# Patient Record
Sex: Female | Born: 1995 | Hispanic: No | Marital: Single | Smoking: Current every day smoker
Health system: Southern US, Community
[De-identification: ages and names within clinical notes are randomized; demographics above are authoritative.]

## PROBLEM LIST (undated history)

## (undated) DIAGNOSIS — N83299 Other ovarian cyst, unspecified side: Secondary | ICD-10-CM

## (undated) DIAGNOSIS — N390 Urinary tract infection, site not specified: Secondary | ICD-10-CM

---

## 2019-04-22 ENCOUNTER — Encounter (HOSPITAL_COMMUNITY): Payer: Self-pay | Admitting: Emergency Medicine

## 2019-04-22 ENCOUNTER — Other Ambulatory Visit: Payer: Self-pay

## 2019-04-22 ENCOUNTER — Emergency Department (HOSPITAL_COMMUNITY)
Admission: EM | Admit: 2019-04-22 | Discharge: 2019-04-22 | Disposition: A | Discharge status: Home / Self Care | Payer: Medicaid - Out of State | Attending: Emergency Medicine | Admitting: Emergency Medicine

## 2019-04-22 ENCOUNTER — Emergency Department (HOSPITAL_COMMUNITY): Payer: Medicaid - Out of State

## 2019-04-22 DIAGNOSIS — Z79899 Other long term (current) drug therapy: Secondary | ICD-10-CM | POA: Insufficient documentation

## 2019-04-22 DIAGNOSIS — R109 Unspecified abdominal pain: Secondary | ICD-10-CM | POA: Diagnosis present

## 2019-04-22 DIAGNOSIS — D259 Leiomyoma of uterus, unspecified: Secondary | ICD-10-CM | POA: Insufficient documentation

## 2019-04-22 DIAGNOSIS — N390 Urinary tract infection, site not specified: Secondary | ICD-10-CM | POA: Diagnosis not present

## 2019-04-22 DIAGNOSIS — F17210 Nicotine dependence, cigarettes, uncomplicated: Secondary | ICD-10-CM | POA: Diagnosis not present

## 2019-04-22 DIAGNOSIS — R102 Pelvic and perineal pain: Secondary | ICD-10-CM | POA: Insufficient documentation

## 2019-04-22 HISTORY — DX: Other ovarian cyst, unspecified side: N83.299

## 2019-04-22 HISTORY — DX: Urinary tract infection, site not specified: N39.0

## 2019-04-22 LAB — PREGNANCY, URINE: Preg Test, Ur: NEGATIVE

## 2019-04-22 LAB — URINALYSIS, ROUTINE W REFLEX MICROSCOPIC
Bilirubin Urine: NEGATIVE
Glucose, UA: NEGATIVE mg/dL
Ketones, ur: NEGATIVE mg/dL
Nitrite: NEGATIVE
Protein, ur: 30 mg/dL — AB
Specific Gravity, Urine: 1.008 (ref 1.005–1.030)
pH: 6 (ref 5.0–8.0)

## 2019-04-22 MED ORDER — IBUPROFEN 800 MG PO TABS
800.0000 mg | ORAL_TABLET | Freq: Once | ORAL | Status: AC
  Administered 2019-04-22: 18:00:00 800 mg via ORAL
  Filled 2019-04-22: qty 1

## 2019-04-22 MED ORDER — CEPHALEXIN 500 MG PO CAPS: 500.0000 mg | ORAL_CAPSULE | Freq: Four times a day (QID) | ORAL | 0 refills | Status: AC

## 2019-04-22 MED ORDER — KETOROLAC TROMETHAMINE 30 MG/ML IJ SOLN
30.0000 mg | Freq: Once | INTRAMUSCULAR | Status: DC
  Filled 2019-04-22: qty 1

## 2019-04-22 NOTE — Discharge Instructions (Addendum)
Your work-up today shows that you have a UTI, take antibiotics as directed for the next week.  Your pregnancy test was negative.  Your ultrasound did show that you have a uterine fibroid it is important that you follow-up with OB/GYN regarding this can be contributing to your pain and cramping.  Did not see any ovarian cyst today.  Take ibuprofen 800 mg every 8 hours to help with pain, you can use Tylenol in addition to this.

## 2019-04-22 NOTE — ED Triage Notes (Signed)
Pt states she is having menstrual cramps with no menses that is due today.

## 2019-04-22 NOTE — ED Provider Notes (Signed)
[facility] EMERGENCY DEPARTMENT Provider Note   CSN: [id number] Arrival date & time: 04/22/19  1445     History Chief Complaint  Patient presents with  . Abdominal Pain    Brandy Mcbride is a 24 y.o. female.  Brandy Mcbride is a 24 y.o. female with a history of ovarian cyst, who presents to the ED for evaluation of 2 days of lower abdominal cramping.  She states this feels consistent with cramps that she usually has with her menstrual cycle, and she is due to start her period today but has not started her cycle.  She reports that her periods have been very regular recently and this had her concerned.  She does not think that she is pregnant, has not recently been sexually active.  She has not noted any vaginal discharge, and has not had any pelvic discomfort until this cramping began.  No upper abdominal pain, no nausea or vomiting, no changes in bowel movements.  She does report some urinary frequency and reports she is prone to UTIs, denies any dysuria, flank pain, hematuria, fevers or chills.  No other aggravating or alleviating factors.        Past Medical History:  Diagnosis Date  . Functional ovarian cysts   . UTI (urinary tract infection)    chronic    There are no problems to display for this patient.   History reviewed. No pertinent surgical history.   OB History    Gravida  1   Para      Term      Preterm      AB      Living        SAB      TAB      Ectopic      Multiple      Live Births              No family history on file.  Social History   Tobacco Use  . Smoking status: Current Every Day Smoker    Packs/day: 0.50    Types: Cigarettes  . Smokeless tobacco: Never Used  Substance Use Topics  . Alcohol use: Yes    Alcohol/week: 3.0 standard drinks    Types: 1 Glasses of wine, 1 Cans of beer, 1 Shots of liquor per week    Comment: occ  . Drug use: Yes    Frequency: 1.0 times per week    Types: Marijuana    Home  Medications Prior to Admission medications   Medication Sig Start Date End Date Taking? Authorizing Provider  acetaminophen (MIDOL) 650 MG CR tablet Take 650 mg by mouth every 8 (eight) hours as needed for pain.   Yes [provider]  cephALEXin (KEFLEX) 500 MG capsule Take 1 capsule (500 mg total) by mouth 4 (four) times daily. 04/22/19   Jacqlyn Larsen, PA-C    Allergies    Morphine and related  Review of Systems   Review of Systems  Constitutional: Negative for chills and fever.  HENT: Negative.   Respiratory: Negative for shortness of breath.   Cardiovascular: Negative for chest pain.  Gastrointestinal: Positive for abdominal pain. Negative for constipation, diarrhea, nausea and vomiting.  Genitourinary: Positive for dysuria and pelvic pain. Negative for flank pain, frequency, genital sores, vaginal bleeding and vaginal discharge.  Musculoskeletal: Negative for arthralgias and myalgias.  Skin: Negative for color change and rash.  Neurological: Negative for dizziness, syncope and light-headedness.  All other systems reviewed and are negative.  Physical Exam Updated Vital Signs BP (!) 144/92 (BP Location: Right Arm)   Pulse 82   Temp 98.4 F (36.9 C) (Oral)   Resp 16   Ht 5\' 3"  (1.6 m)   Wt 103 kg   LMP 03/22/2019 (Exact Date)   SpO2 100%   BMI 40.21 kg/m   Physical Exam Vitals and nursing note reviewed.  Constitutional:      General: She is not in acute distress.    Appearance: She is well-developed. She is obese. She is not ill-appearing or diaphoretic.  HENT:     Head: Normocephalic and atraumatic.  Eyes:     General:        Right eye: No discharge.        Left eye: No discharge.     Pupils: Pupils are equal, round, and reactive to light.  Cardiovascular:     Rate and Rhythm: Normal rate and regular rhythm.     Heart sounds: Normal heart sounds. No murmur. No friction rub. No gallop.   Pulmonary:     Effort: Pulmonary effort is normal. No  respiratory distress.     Breath sounds: Normal breath sounds. No wheezing or rales.     Comments: Respirations equal and unlabored, patient able to speak in full sentences, lungs clear to auscultation bilaterally Abdominal:     General: Bowel sounds are normal. There is no distension.     Palpations: Abdomen is soft. There is no mass.     Tenderness: There is abdominal tenderness. There is no guarding.     Comments: Abdomen is soft, nondistended, bowel sounds present throughout, there is mild tenderness noted across the lower abdomen and pelvic region that does not localize to 1 side, no guarding or rebound tenderness noted  Musculoskeletal:        General: No deformity.     Cervical back: Neck supple.  Skin:    General: Skin is warm and dry.     Capillary Refill: Capillary refill takes less than 2 seconds.  Neurological:     Mental Status: She is alert.     Coordination: Coordination normal.     Comments: Speech is clear, able to follow commands Moves extremities without ataxia, coordination intact  Psychiatric:        Mood and Affect: Mood normal.        Behavior: Behavior normal.     ED Results / Procedures / Treatments   Labs (all labs ordered are listed, but only abnormal results are displayed) Labs Reviewed  URINALYSIS, ROUTINE W REFLEX MICROSCOPIC - Abnormal; Notable for the following components:      Result Value   APPearance HAZY (*)    Hgb urine dipstick SMALL (*)    Protein, ur 30 (*)    Leukocytes,Ua LARGE (*)    Bacteria, UA RARE (*)    All other components within normal limits  URINE CULTURE  PREGNANCY, URINE  POC URINE PREG, ED    EKG None  Radiology US Transvaginal Non-OB  Result Date: 04/22/2019 CLINICAL DATA:  Pelvic pain and cramping for 1 day. EXAM: TRANSABDOMINAL AND TRANSVAGINAL ULTRASOUND OF PELVIS DOPPLER ULTRASOUND OF OVARIES TECHNIQUE: Both transabdominal and transvaginal ultrasound examinations of the pelvis were performed. Transabdominal  technique was performed for global imaging of the pelvis including uterus, ovaries, adnexal regions, and pelvic cul-de-sac. It was necessary to proceed with endovaginal exam following the transabdominal exam to visualize the endometrium and ovaries. Color and duplex Doppler ultrasound was utilized to evaluate blood  flow to the ovaries. COMPARISON:  None. FINDINGS: Uterus Measurements: 6.8 x 2.3 x 4.1 cm = volume: 33.6 mL. The uterus is retroflexed. There is a fibroid at the fundus measuring 12 x 8 x 11 mm. There is a mass which demonstrates central increased echogenicity near the junction of the cervix and lower uterine segment. This mass measures 4 mm in all dimensions. Endometrium Thickness: 4.7 mm.  No focal abnormality visualized. Right ovary Measurements: 2.8 x 1.2 x 2.5 cm = volume: 4.3 mL. Normal appearance/no adnexal mass. Left ovary Measurements: 3.5 x 1.4 x 2.5 cm = volume: 6.2 mL. Normal appearance/no adnexal mass. Pulsed Doppler evaluation of both ovaries demonstrates normal low-resistance arterial and venous waveforms. Other findings Trace fluid in the pelvis is likely physiologic. No other abnormalities. IMPRESSION: 1. There is a small 4 mm mass likely in the lower uterine segment near the junction of the uterus and cervix. This mass demonstrates central increased echogenicity which may represent calcification. This could represent a small subendometrial calcified fibroadenoma. A small calcified polyp is a possibility. A calcified nabothian cyst is also a possibility. Recommend evaluation of the cervix with physical exam. Consider sonohysterography to exclude a polyp. 2. Fibroid at the uterine fundus measuring 12 mm. 3. The ovaries are normal in appearance with no evidence of torsion. The endometrium is normal. 4. A small amount of physiologic fluid is seen in the pelvis. Electronically Signed   By: Dorise Bullion III M.D   On: 04/22/2019 17:32   US Pelvis Complete  Result Date:  04/22/2019 CLINICAL DATA:  Pelvic pain and cramping for 1 day. EXAM: TRANSABDOMINAL AND TRANSVAGINAL ULTRASOUND OF PELVIS DOPPLER ULTRASOUND OF OVARIES TECHNIQUE: Both transabdominal and transvaginal ultrasound examinations of the pelvis were performed. Transabdominal technique was performed for global imaging of the pelvis including uterus, ovaries, adnexal regions, and pelvic cul-de-sac. It was necessary to proceed with endovaginal exam following the transabdominal exam to visualize the endometrium and ovaries. Color and duplex Doppler ultrasound was utilized to evaluate blood flow to the ovaries. COMPARISON:  None. FINDINGS: Uterus Measurements: 6.8 x 2.3 x 4.1 cm = volume: 33.6 mL. The uterus is retroflexed. There is a fibroid at the fundus measuring 12 x 8 x 11 mm. There is a mass which demonstrates central increased echogenicity near the junction of the cervix and lower uterine segment. This mass measures 4 mm in all dimensions. Endometrium Thickness: 4.7 mm.  No focal abnormality visualized. Right ovary Measurements: 2.8 x 1.2 x 2.5 cm = volume: 4.3 mL. Normal appearance/no adnexal mass. Left ovary Measurements: 3.5 x 1.4 x 2.5 cm = volume: 6.2 mL. Normal appearance/no adnexal mass. Pulsed Doppler evaluation of both ovaries demonstrates normal low-resistance arterial and venous waveforms. Other findings Trace fluid in the pelvis is likely physiologic. No other abnormalities. IMPRESSION: 1. There is a small 4 mm mass likely in the lower uterine segment near the junction of the uterus and cervix. This mass demonstrates central increased echogenicity which may represent calcification. This could represent a small subendometrial calcified fibroadenoma. A small calcified polyp is a possibility. A calcified nabothian cyst is also a possibility. Recommend evaluation of the cervix with physical exam. Consider sonohysterography to exclude a polyp. 2. Fibroid at the uterine fundus measuring 12 mm. 3. The ovaries are  normal in appearance with no evidence of torsion. The endometrium is normal. 4. A small amount of physiologic fluid is seen in the pelvis. Electronically Signed   By: Dorise Bullion III M.D   On:  04/22/2019 17:32   [place] Art/Ven Flow Abd Pelv Doppler  Result Date: 04/22/2019 CLINICAL DATA:  Pelvic pain and cramping for 1 day. EXAM: TRANSABDOMINAL AND TRANSVAGINAL ULTRASOUND OF PELVIS DOPPLER ULTRASOUND OF OVARIES TECHNIQUE: Both transabdominal and transvaginal ultrasound examinations of the pelvis were performed. Transabdominal technique was performed for global imaging of the pelvis including uterus, ovaries, adnexal regions, and pelvic cul-de-sac. It was necessary to proceed with endovaginal exam following the transabdominal exam to visualize the endometrium and ovaries. Color and duplex Doppler ultrasound was utilized to evaluate blood flow to the ovaries. COMPARISON:  None. FINDINGS: Uterus Measurements: 6.8 x 2.3 x 4.1 cm = volume: 33.6 mL. The uterus is retroflexed. There is a fibroid at the fundus measuring 12 x 8 x 11 mm. There is a mass which demonstrates central increased echogenicity near the junction of the cervix and lower uterine segment. This mass measures 4 mm in all dimensions. Endometrium Thickness: 4.7 mm.  No focal abnormality visualized. Right ovary Measurements: 2.8 x 1.2 x 2.5 cm = volume: 4.3 mL. Normal appearance/no adnexal mass. Left ovary Measurements: 3.5 x 1.4 x 2.5 cm = volume: 6.2 mL. Normal appearance/no adnexal mass. Pulsed Doppler evaluation of both ovaries demonstrates normal low-resistance arterial and venous waveforms. Other findings Trace fluid in the pelvis is likely physiologic. No other abnormalities. IMPRESSION: 1. There is a small 4 mm mass likely in the lower uterine segment near the junction of the uterus and cervix. This mass demonstrates central increased echogenicity which may represent calcification. This could represent a small subendometrial calcified  fibroadenoma. A small calcified polyp is a possibility. A calcified nabothian cyst is also a possibility. Recommend evaluation of the cervix with physical exam. Consider sonohysterography to exclude a polyp. 2. Fibroid at the uterine fundus measuring 12 mm. 3. The ovaries are normal in appearance with no evidence of torsion. The endometrium is normal. 4. A small amount of physiologic fluid is seen in the pelvis. Electronically Signed   By: Dorise Bullion III M.D   On: 04/22/2019 17:32    Procedures Procedures (including critical care time)  Medications Ordered in ED Medications  ibuprofen (ADVIL) tablet 800 mg (800 mg Oral Given 04/22/19 1730)    ED Course  I have reviewed the triage vital signs and the nursing notes.  Pertinent labs & imaging results that were available during my care of the patient were reviewed by me and considered in my medical decision making (see chart for details).    MDM Rules/Calculators/A&P                      23 year old female presents with lower abdominal pain consistent with previous menstrual cycle cramps, but her cycle has not started yet.  She has a history of bilateral ovarian cysts and is worried this could be contributing.  She has not had any vaginal discharge and reports she has not been sexually active recently, has no concern for STDs.  Negative pregnancy.  Does also have a history of frequent UTIs.  Will check urinalysis and get pelvic ultrasound.  Without vaginal discharge or concern for STDs, and with reassuring abdominal exam do not feel that pelvic exam is necessary and patient would prefer not to given p.o. comfort.  Urinalysis concerning for infection with large leukocytes, 21-50 WBCs and bacteria present.  Pelvic ultrasound shows resolution of previous ovarian cyst, there is a small calcified mass that is likely a cyst or fibroadenoma of the cervix, will have patient follow-up  with OB/GYN regarding this.  She also has a 12 mm uterine fibroid  which could certainly be contributing to her discomfort and cramping.  No other significant abnormalities noted.  Discussed reassuring ultrasound with patient.  We will have her follow-up closely with OB/GYN.  Return precautions discussed.  Patient expressed understanding and agreement with plan.  Final Clinical Impression(s) / ED Diagnoses Final diagnoses:  Pelvic cramping  Uterine leiomyoma, unspecified location  Acute UTI    Rx / DC Orders ED Discharge Orders         Ordered    cephALEXin (KEFLEX) 500 MG capsule  4 times daily     04/22/19 1851           Jacqlyn Larsen, [place] 04/26/19 1840    Long, [place], MD 04/27/19 [phone]

## 2019-04-24 LAB — URINE CULTURE

## 2021-11-15 IMAGING — US US ART/VEN ABD/PELV/SCROTUM DOPPLER LTD
1 series · 13 of 25 positions shown · non-contrast
Comparison: None.

CLINICAL DATA: Pelvic pain and cramping for 1 day.

EXAM:
TRANSABDOMINAL AND TRANSVAGINAL ULTRASOUND OF PELVIS
DOPPLER ULTRASOUND OF OVARIES
TECHNIQUE: Both transabdominal and transvaginal ultrasound examinations of the
pelvis were performed. Transabdominal technique was performed for
global imaging of the pelvis including uterus, ovaries, adnexal
regions, and pelvic cul-de-sac.
It was necessary to proceed with endovaginal exam following the
transabdominal exam to visualize the endometrium and ovaries. Color
and duplex Doppler ultrasound was utilized to evaluate blood flow to
the ovaries.

[Series 1: us pelvis transvaginal non-ob (tv only) · 13 of 94 slices shown]
[im 1/94]
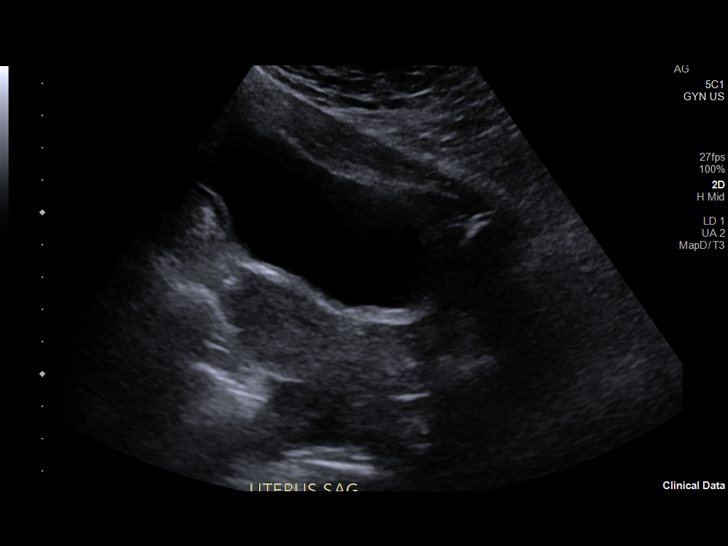
[im 8/94]
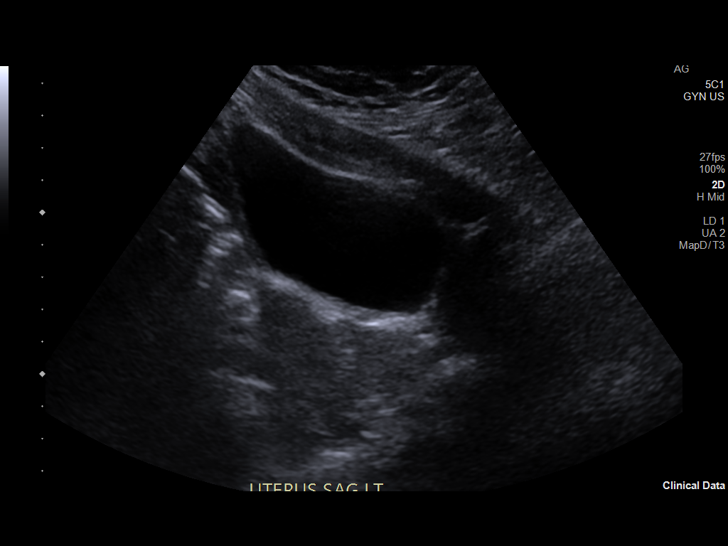
[im 16/94]
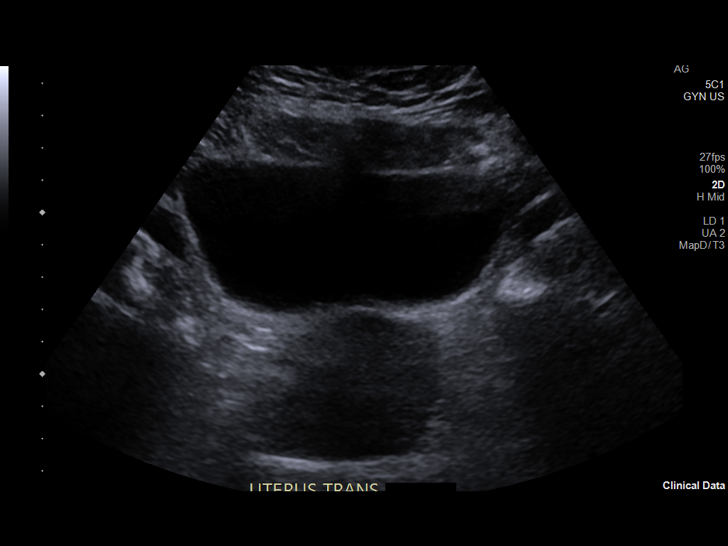
[im 24/94]
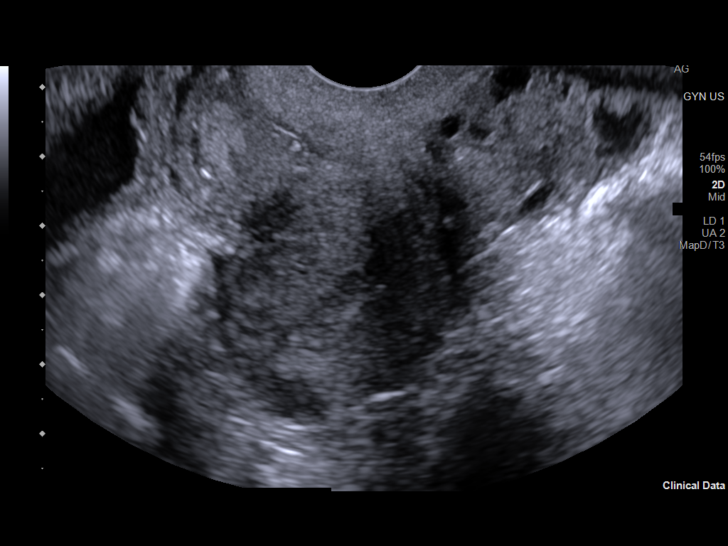
[im 32/94]
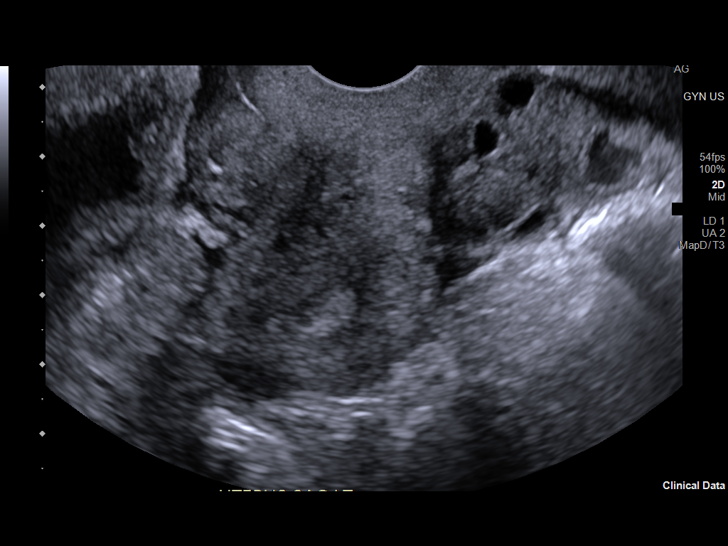
[im 39/94]
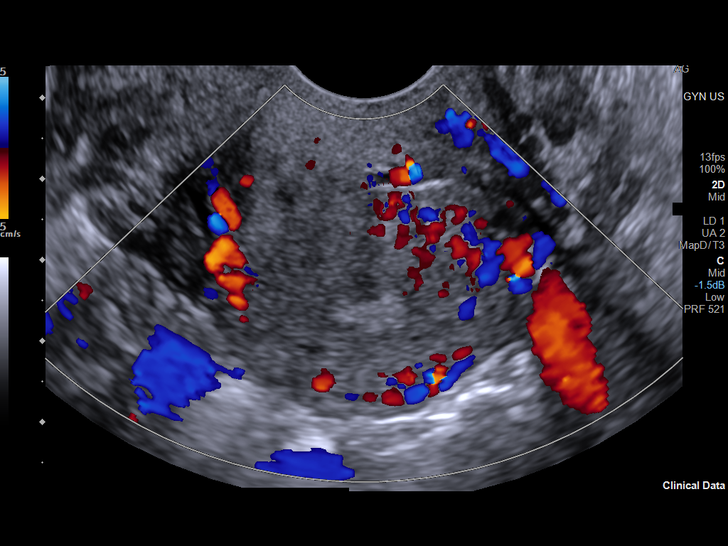
[im 47/94]
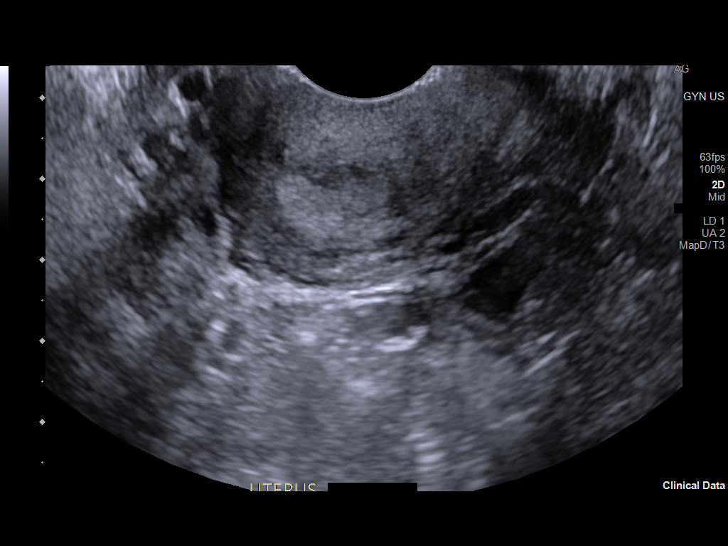
[im 55/94]
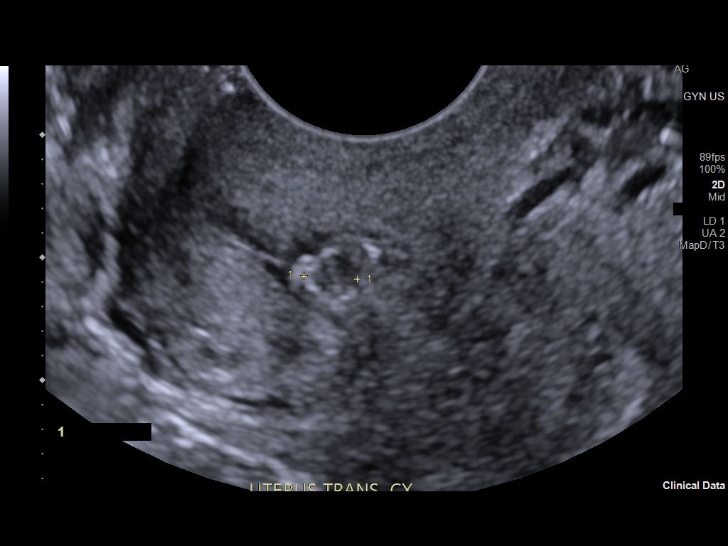
[im 63/94]
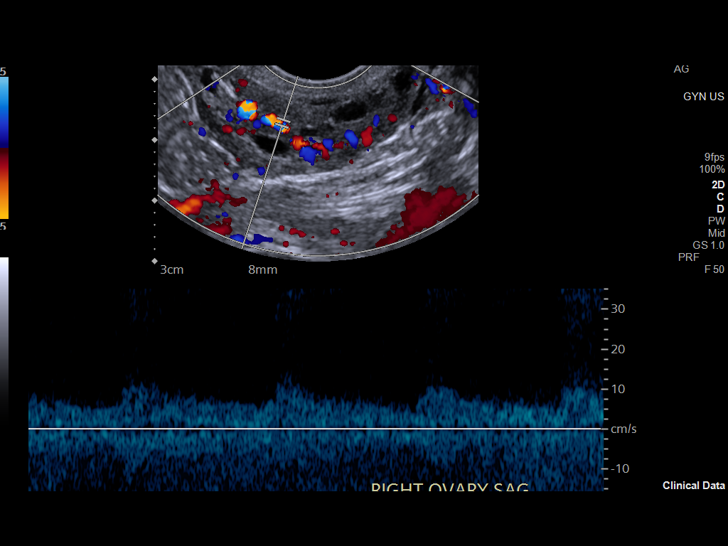
[im 70/94]
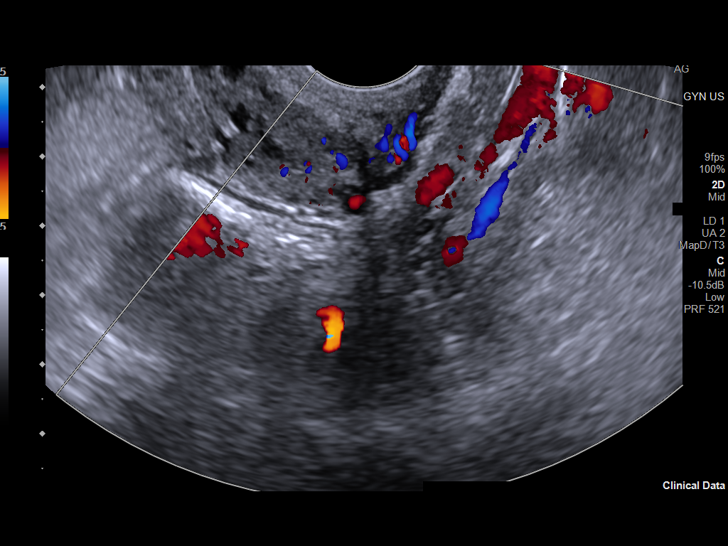
[im 78/94]
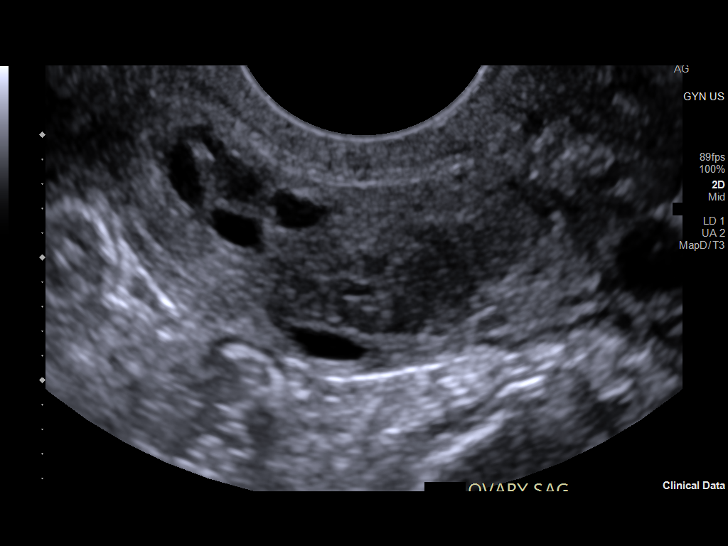
[im 86/94]
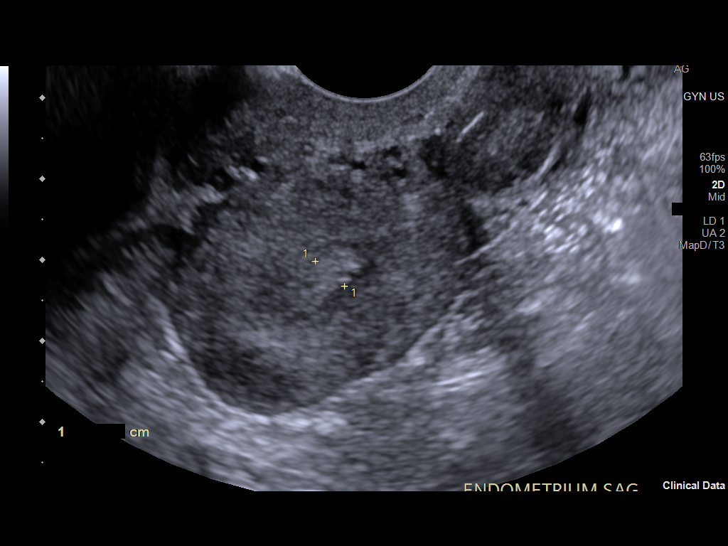
[im 94/94]
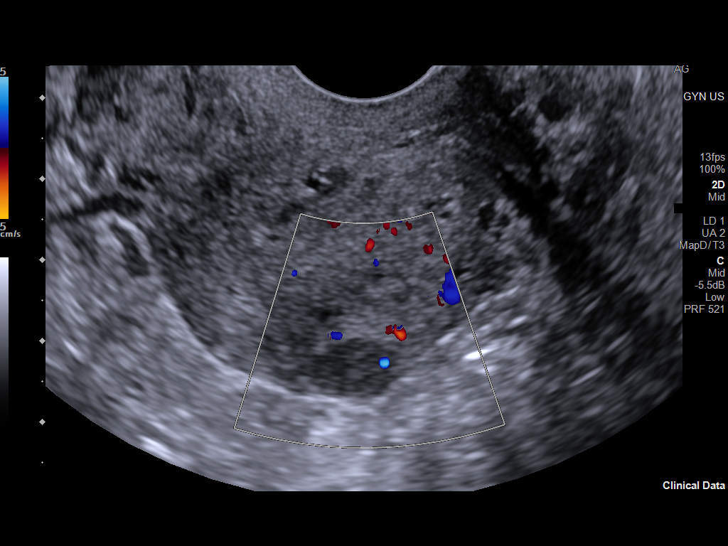

[13 of 25 positions shown; findings below may reference images not displayed]

FINDINGS: Uterus

Measurements: 6.8 x 2.3 x 4.1 cm = volume: 33.6 mL. The uterus is
retroflexed. There is a fibroid at the fundus measuring 12 x 8 x 11
mm. There is a mass which demonstrates central increased
echogenicity near the junction of the cervix and lower uterine
segment. This mass measures 4 mm in all dimensions.

Endometrium

Thickness: 4.7 mm.  No focal abnormality visualized.

Right ovary

Measurements: 2.8 x 1.2 x 2.5 cm = volume: 4.3 mL. Normal
appearance/no adnexal mass.

Left ovary

Measurements: 3.5 x 1.4 x 2.5 cm = volume: 6.2 mL. Normal
appearance/no adnexal mass.

Pulsed Doppler evaluation of both ovaries demonstrates normal
low-resistance arterial and venous waveforms.

Other findings

Trace fluid in the pelvis is likely physiologic. No other
abnormalities.
IMPRESSION: 1. There is a small 4 mm mass likely in the lower uterine segment
near the junction of the uterus and cervix. This mass demonstrates
central increased echogenicity which may represent calcification.
This could represent a small subendometrial calcified fibroadenoma.
A small calcified polyp is a possibility. A calcified nabothian cyst
is also a possibility. Recommend evaluation of the cervix with
physical exam. Consider sonohysterography to exclude a polyp.
2. Fibroid at the uterine fundus measuring 12 mm.
3. The ovaries are normal in appearance with no evidence of torsion.
The endometrium is normal.
4. A small amount of physiologic fluid is seen in the pelvis.

## 2021-11-15 IMAGING — US US PELVIS COMPLETE
1 series · 13 of 25 positions shown · non-contrast
Comparison: None.

CLINICAL DATA: Pelvic pain and cramping for 1 day.

EXAM:
TRANSABDOMINAL AND TRANSVAGINAL ULTRASOUND OF PELVIS
DOPPLER ULTRASOUND OF OVARIES
TECHNIQUE: Both transabdominal and transvaginal ultrasound examinations of the
pelvis were performed. Transabdominal technique was performed for
global imaging of the pelvis including uterus, ovaries, adnexal
regions, and pelvic cul-de-sac.
It was necessary to proceed with endovaginal exam following the
transabdominal exam to visualize the endometrium and ovaries. Color
and duplex Doppler ultrasound was utilized to evaluate blood flow to
the ovaries.

[Series 1: us pelvis transvaginal non-ob (tv only) · 13 of 94 slices shown]
[im 1/94]
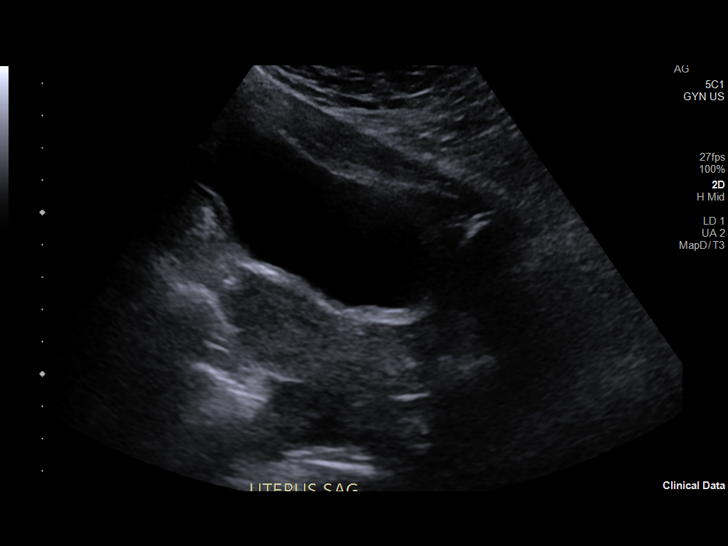
[im 8/94]
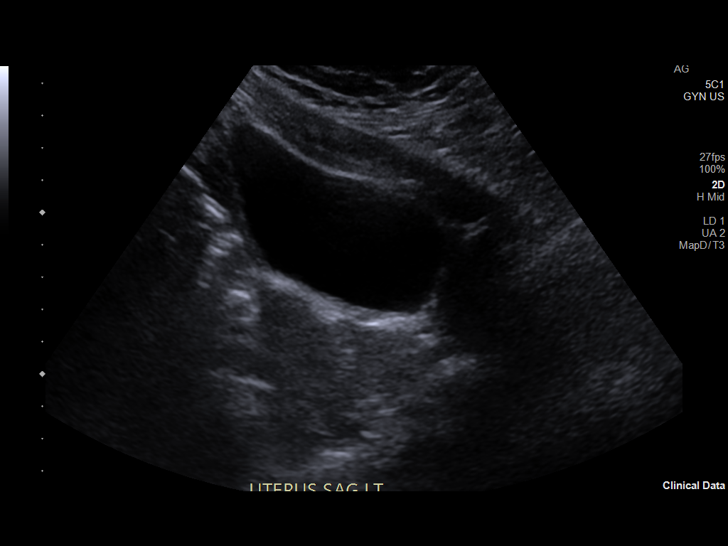
[im 16/94]
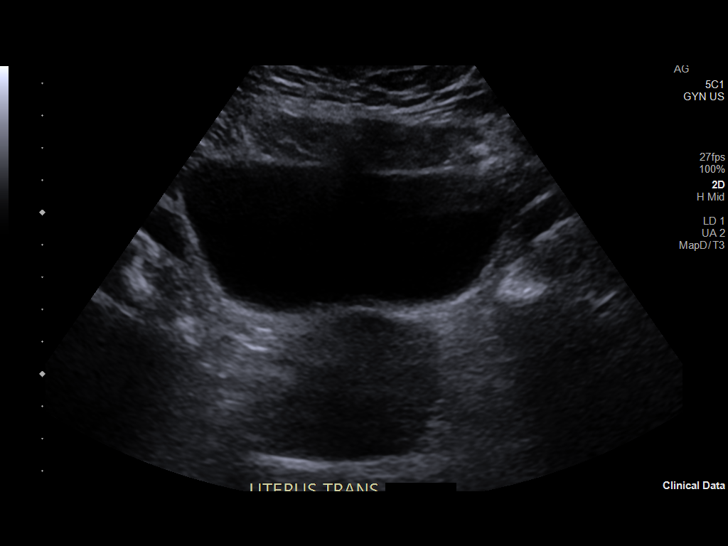
[im 24/94]
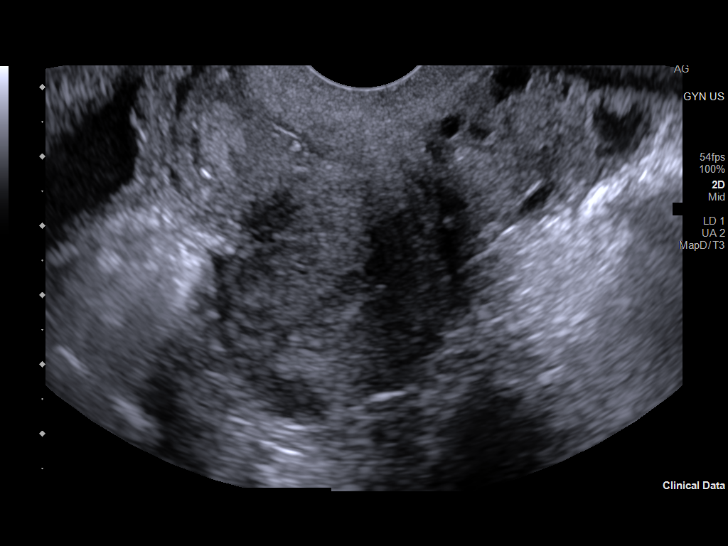
[im 32/94]
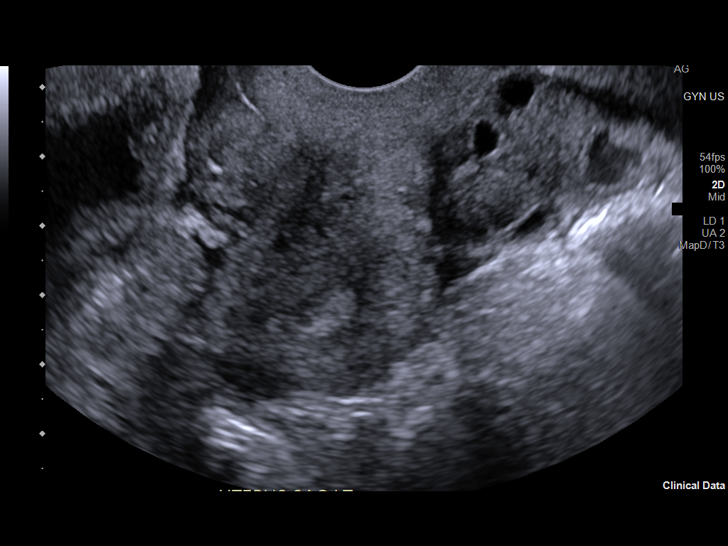
[im 39/94]
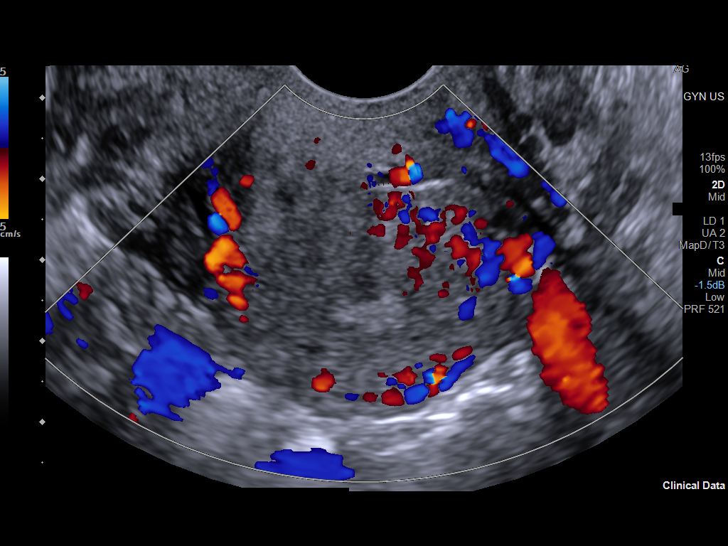
[im 47/94]
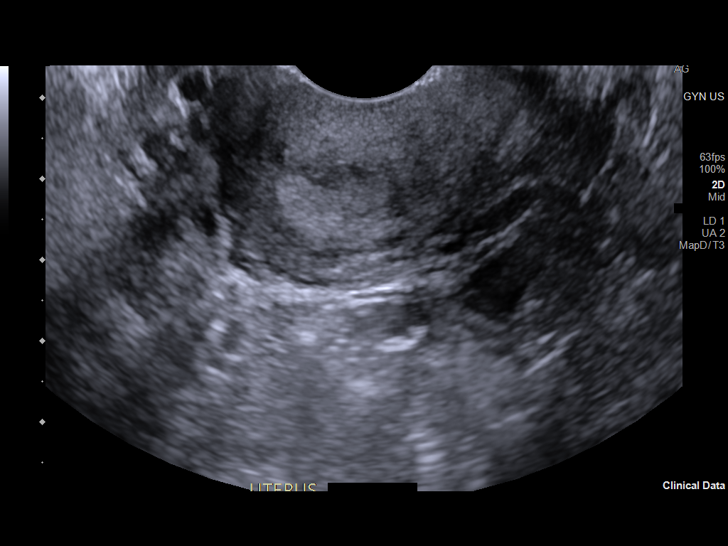
[im 55/94]
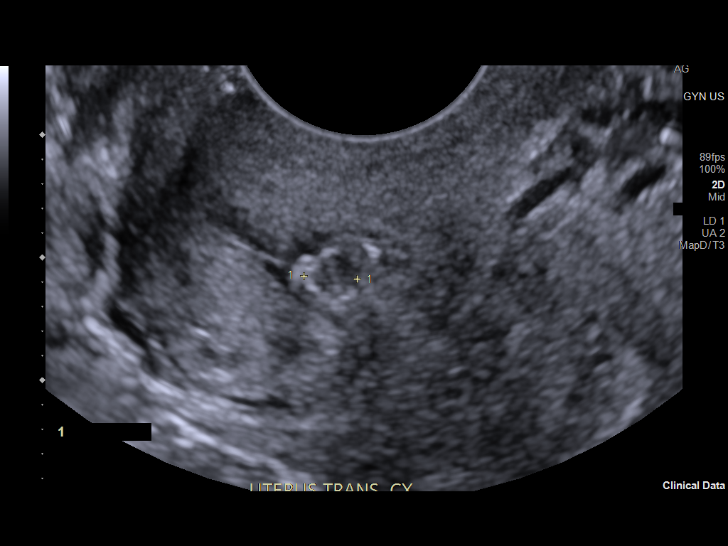
[im 63/94]
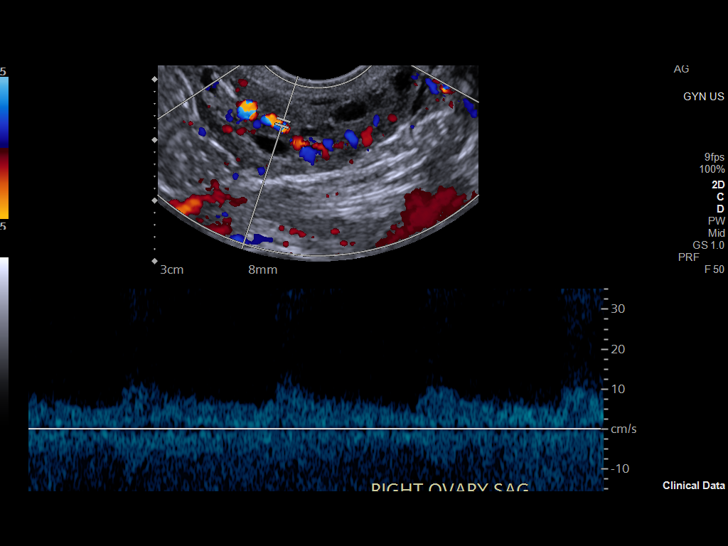
[im 70/94]
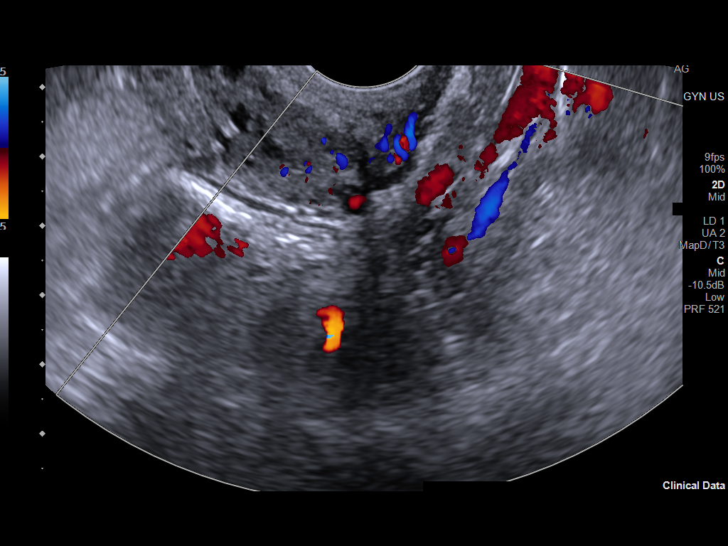
[im 78/94]
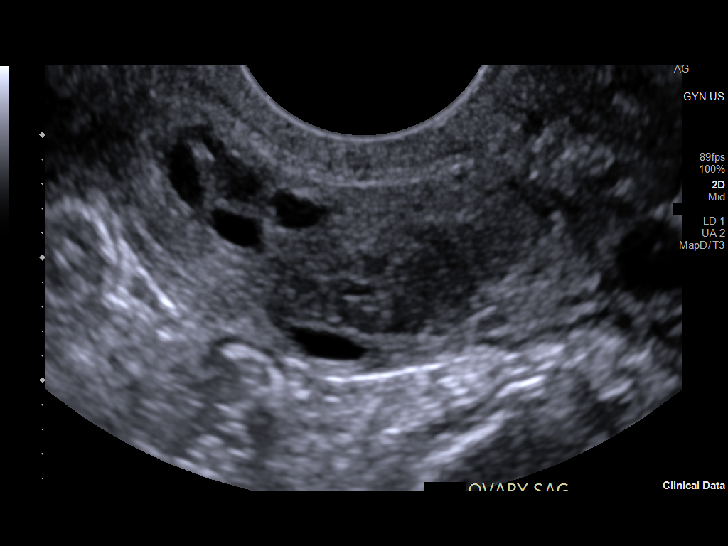
[im 86/94]
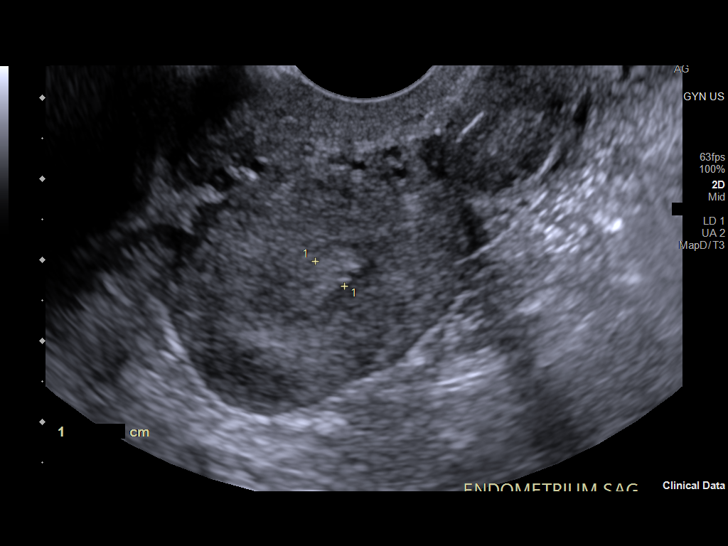
[im 94/94]
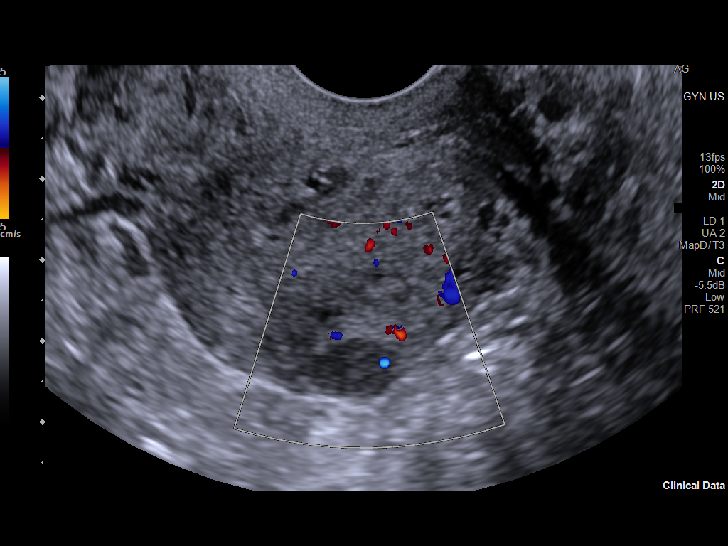

[13 of 25 positions shown; findings below may reference images not displayed]

FINDINGS: Uterus

Measurements: 6.8 x 2.3 x 4.1 cm = volume: 33.6 mL. The uterus is
retroflexed. There is a fibroid at the fundus measuring 12 x 8 x 11
mm. There is a mass which demonstrates central increased
echogenicity near the junction of the cervix and lower uterine
segment. This mass measures 4 mm in all dimensions.

Endometrium

Thickness: 4.7 mm.  No focal abnormality visualized.

Right ovary

Measurements: 2.8 x 1.2 x 2.5 cm = volume: 4.3 mL. Normal
appearance/no adnexal mass.

Left ovary

Measurements: 3.5 x 1.4 x 2.5 cm = volume: 6.2 mL. Normal
appearance/no adnexal mass.

Pulsed Doppler evaluation of both ovaries demonstrates normal
low-resistance arterial and venous waveforms.

Other findings

Trace fluid in the pelvis is likely physiologic. No other
abnormalities.
IMPRESSION: 1. There is a small 4 mm mass likely in the lower uterine segment
near the junction of the uterus and cervix. This mass demonstrates
central increased echogenicity which may represent calcification.
This could represent a small subendometrial calcified fibroadenoma.
A small calcified polyp is a possibility. A calcified nabothian cyst
is also a possibility. Recommend evaluation of the cervix with
physical exam. Consider sonohysterography to exclude a polyp.
2. Fibroid at the uterine fundus measuring 12 mm.
3. The ovaries are normal in appearance with no evidence of torsion.
The endometrium is normal.
4. A small amount of physiologic fluid is seen in the pelvis.

## 2021-11-15 IMAGING — US US TRANSVAGINAL NON-OB
1 series · 13 of 25 positions shown · non-contrast
Comparison: None.

CLINICAL DATA: Pelvic pain and cramping for 1 day.

EXAM:
TRANSABDOMINAL AND TRANSVAGINAL ULTRASOUND OF PELVIS
DOPPLER ULTRASOUND OF OVARIES
TECHNIQUE: Both transabdominal and transvaginal ultrasound examinations of the
pelvis were performed. Transabdominal technique was performed for
global imaging of the pelvis including uterus, ovaries, adnexal
regions, and pelvic cul-de-sac.
It was necessary to proceed with endovaginal exam following the
transabdominal exam to visualize the endometrium and ovaries. Color
and duplex Doppler ultrasound was utilized to evaluate blood flow to
the ovaries.

[Series 1: us pelvis transvaginal non-ob (tv only) · 13 of 94 slices shown]
[im 1/94]
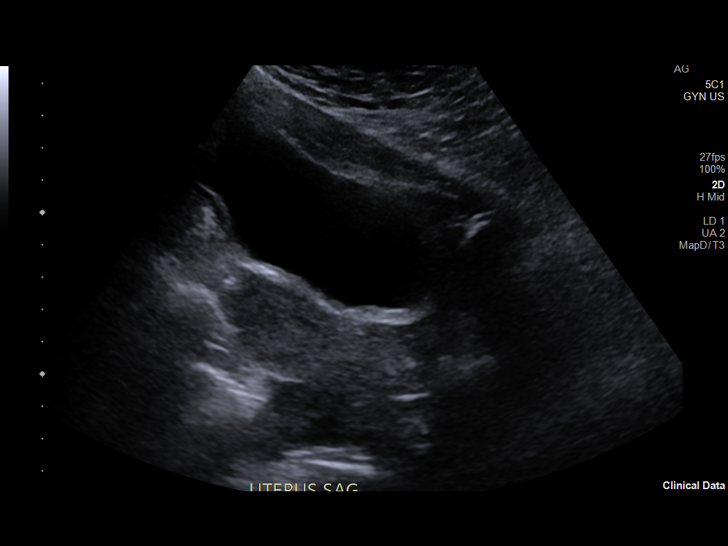
[im 8/94]
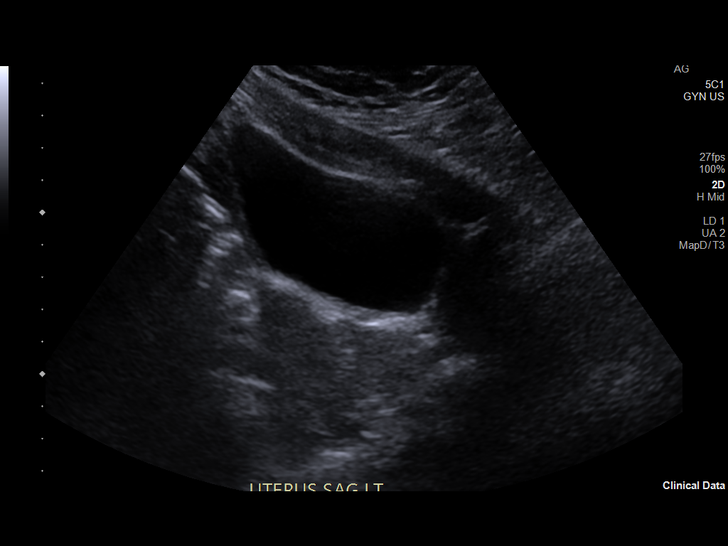
[im 16/94]
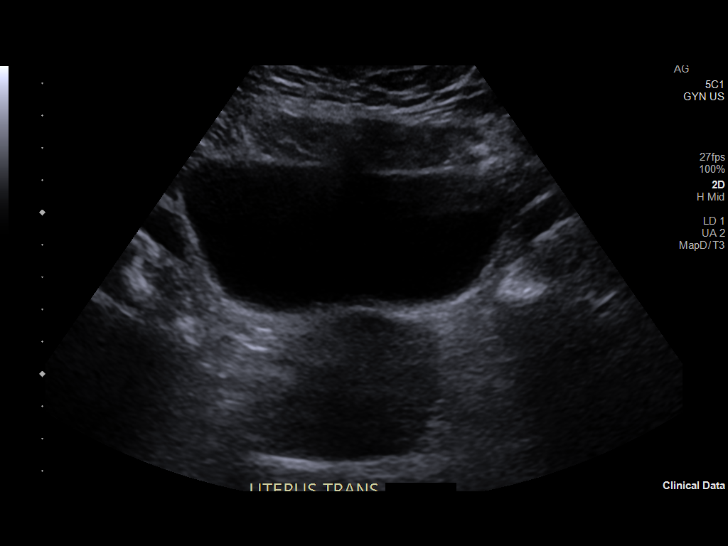
[im 24/94]
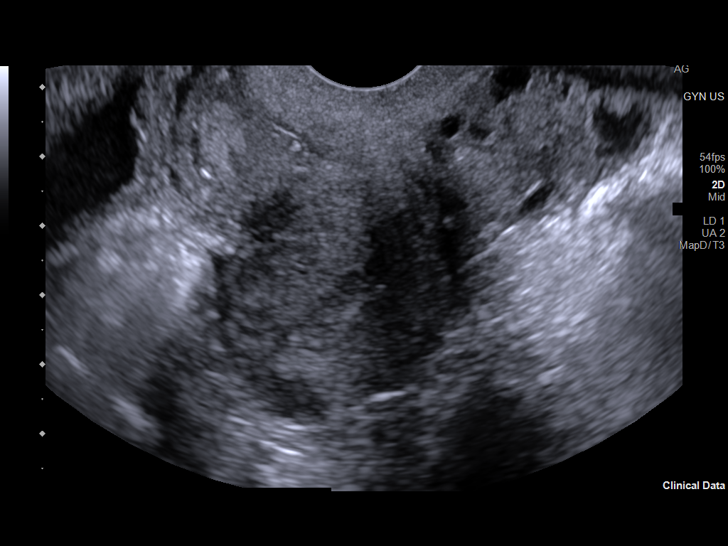
[im 32/94]
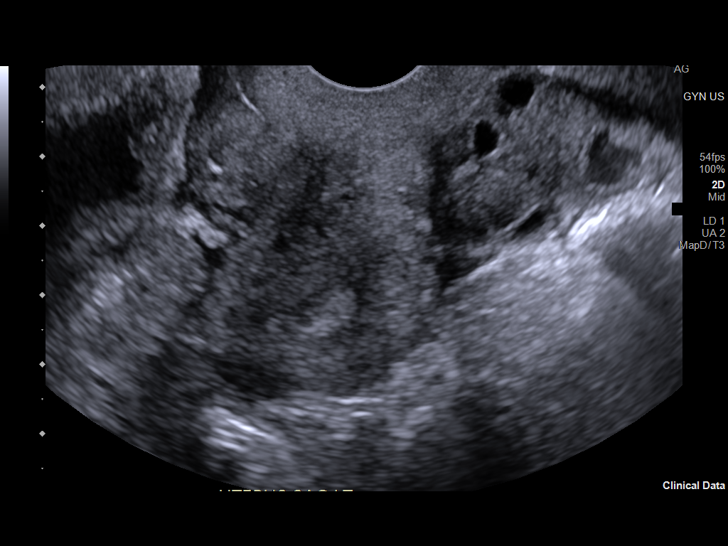
[im 39/94]
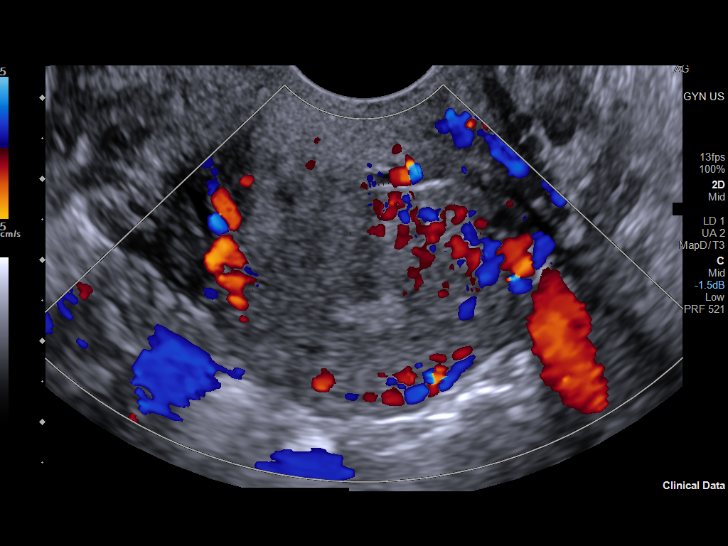
[im 47/94]
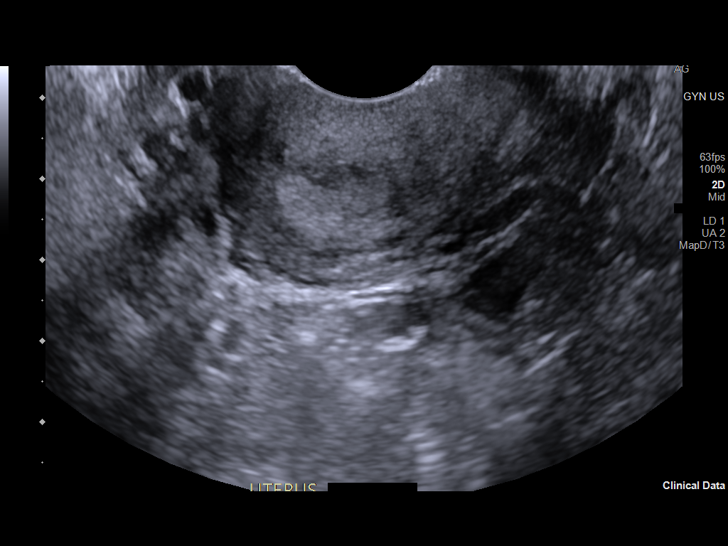
[im 55/94]
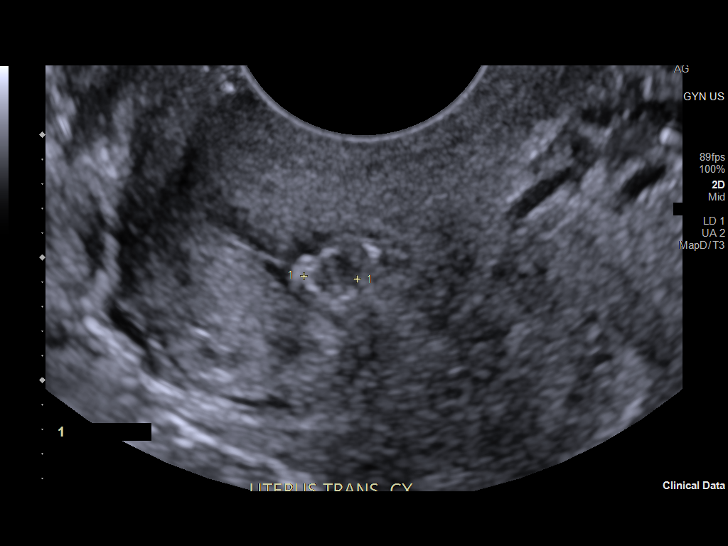
[im 63/94]
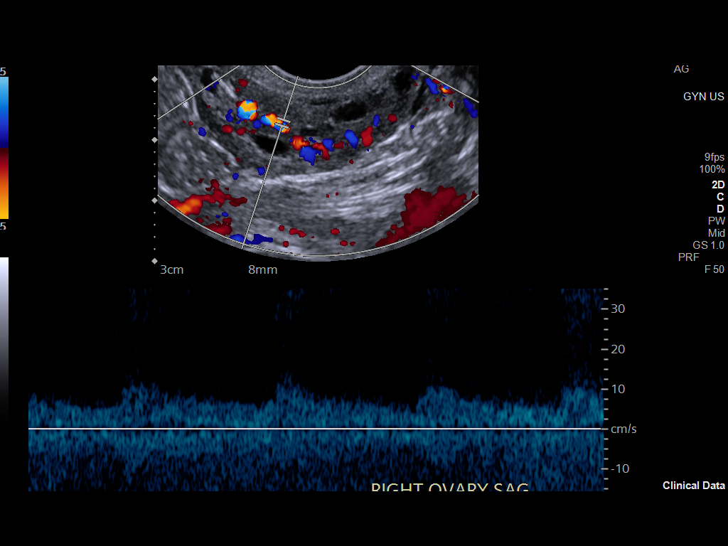
[im 70/94]
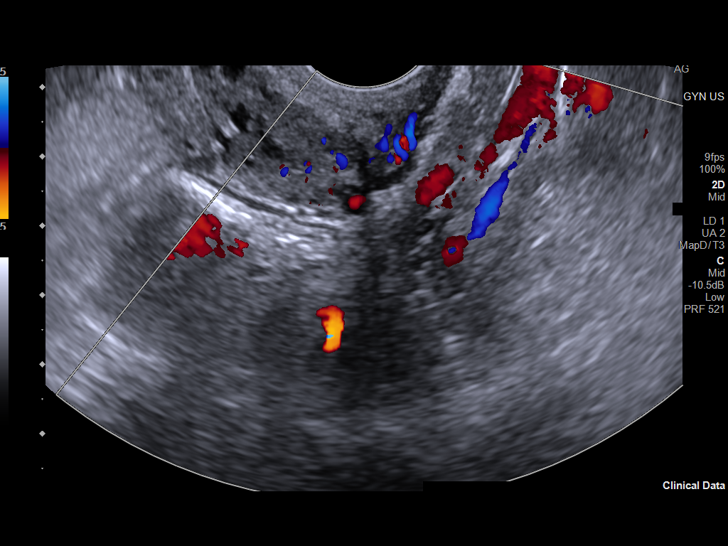
[im 78/94]
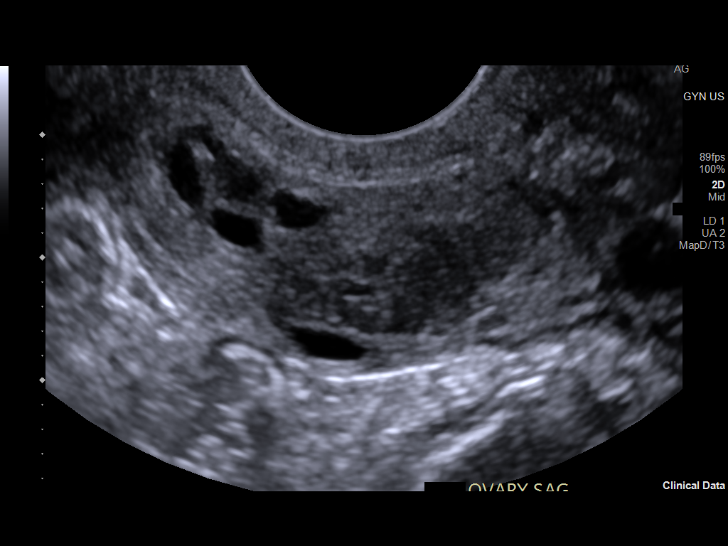
[im 86/94]
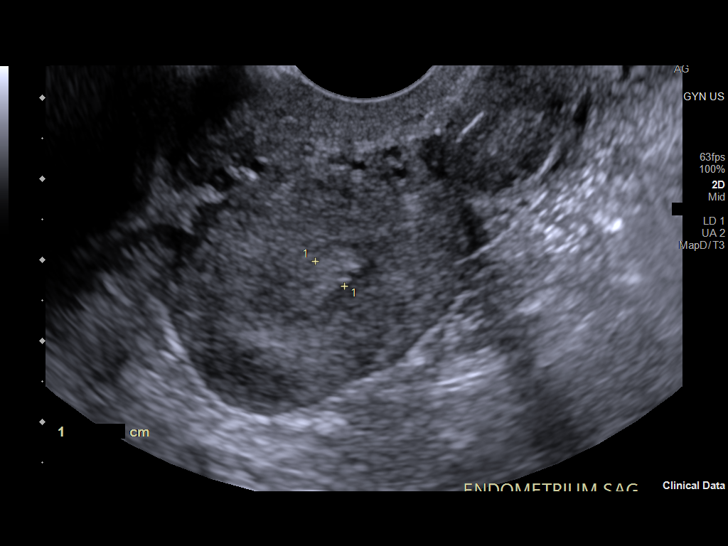
[im 94/94]
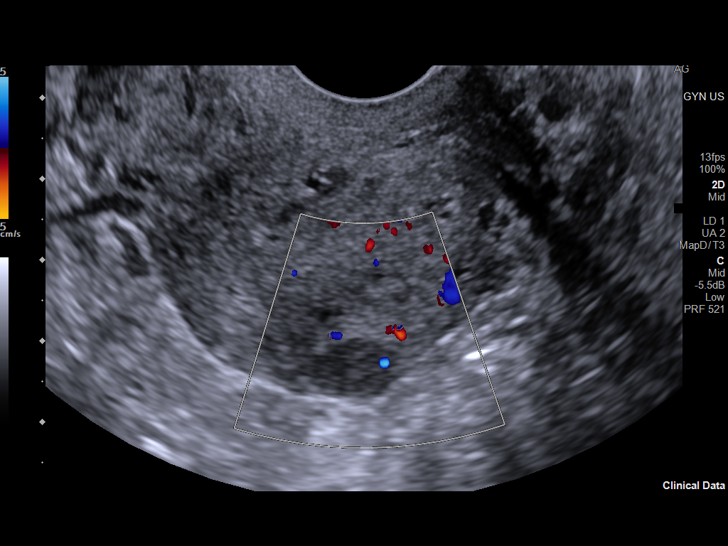

[13 of 25 positions shown; findings below may reference images not displayed]

FINDINGS: Uterus

Measurements: 6.8 x 2.3 x 4.1 cm = volume: 33.6 mL. The uterus is
retroflexed. There is a fibroid at the fundus measuring 12 x 8 x 11
mm. There is a mass which demonstrates central increased
echogenicity near the junction of the cervix and lower uterine
segment. This mass measures 4 mm in all dimensions.

Endometrium

Thickness: 4.7 mm.  No focal abnormality visualized.

Right ovary

Measurements: 2.8 x 1.2 x 2.5 cm = volume: 4.3 mL. Normal
appearance/no adnexal mass.

Left ovary

Measurements: 3.5 x 1.4 x 2.5 cm = volume: 6.2 mL. Normal
appearance/no adnexal mass.

Pulsed Doppler evaluation of both ovaries demonstrates normal
low-resistance arterial and venous waveforms.

Other findings

Trace fluid in the pelvis is likely physiologic. No other
abnormalities.
IMPRESSION: 1. There is a small 4 mm mass likely in the lower uterine segment
near the junction of the uterus and cervix. This mass demonstrates
central increased echogenicity which may represent calcification.
This could represent a small subendometrial calcified fibroadenoma.
A small calcified polyp is a possibility. A calcified nabothian cyst
is also a possibility. Recommend evaluation of the cervix with
physical exam. Consider sonohysterography to exclude a polyp.
2. Fibroid at the uterine fundus measuring 12 mm.
3. The ovaries are normal in appearance with no evidence of torsion.
The endometrium is normal.
4. A small amount of physiologic fluid is seen in the pelvis.
